# Patient Record
Sex: Male | Born: 1985 | Race: White | Hispanic: No | Marital: Single | Smoking: Never smoker
Health system: Southern US, Community
[De-identification: ages and names within clinical notes are randomized; demographics above are authoritative.]

---
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
## 2010-06-27 ENCOUNTER — Ambulatory Visit: Payer: Self-pay | Admitting: Family Medicine

## 2011-07-30 ENCOUNTER — Ambulatory Visit: Payer: Self-pay | Admitting: Family Medicine

## 2015-05-24 ENCOUNTER — Ambulatory Visit
Admission: RE | Admit: 2015-05-24 | Discharge: 2015-05-24 | Disposition: A | Discharge status: Home / Self Care | Payer: Self-pay | Source: Ambulatory Visit | Attending: General Practice | Admitting: General Practice

## 2015-05-24 ENCOUNTER — Other Ambulatory Visit: Payer: Self-pay | Admitting: *Deleted

## 2015-05-24 ENCOUNTER — Ambulatory Visit
Admission: RE | Admit: 2015-05-24 | Discharge: 2015-05-24 | Disposition: A | Discharge status: Home / Self Care | Payer: Managed Care, Other (non HMO) | Source: Ambulatory Visit | Attending: Diagnostic Radiology | Admitting: Diagnostic Radiology

## 2015-05-24 DIAGNOSIS — M542 Cervicalgia: Secondary | ICD-10-CM | POA: Insufficient documentation

## 2015-05-24 DIAGNOSIS — R200 Anesthesia of skin: Secondary | ICD-10-CM | POA: Insufficient documentation

## 2015-05-24 DIAGNOSIS — R51 Headache: Secondary | ICD-10-CM | POA: Diagnosis not present

## 2015-05-24 DIAGNOSIS — R208 Other disturbances of skin sensation: Secondary | ICD-10-CM | POA: Insufficient documentation

## 2015-11-09 IMAGING — CR DG CERVICAL SPINE COMPLETE 4+V
1 series · 6 of 6 positions shown · non-contrast
Comparison: None.

CLINICAL DATA: Posterior neck pain and headaches. Left arm
numbness.

EXAM:
CERVICAL SPINE  4+ VIEWS

[Series 1: ap · 0.17mm/px · 6 of 6 slices shown]
[im 1/6]
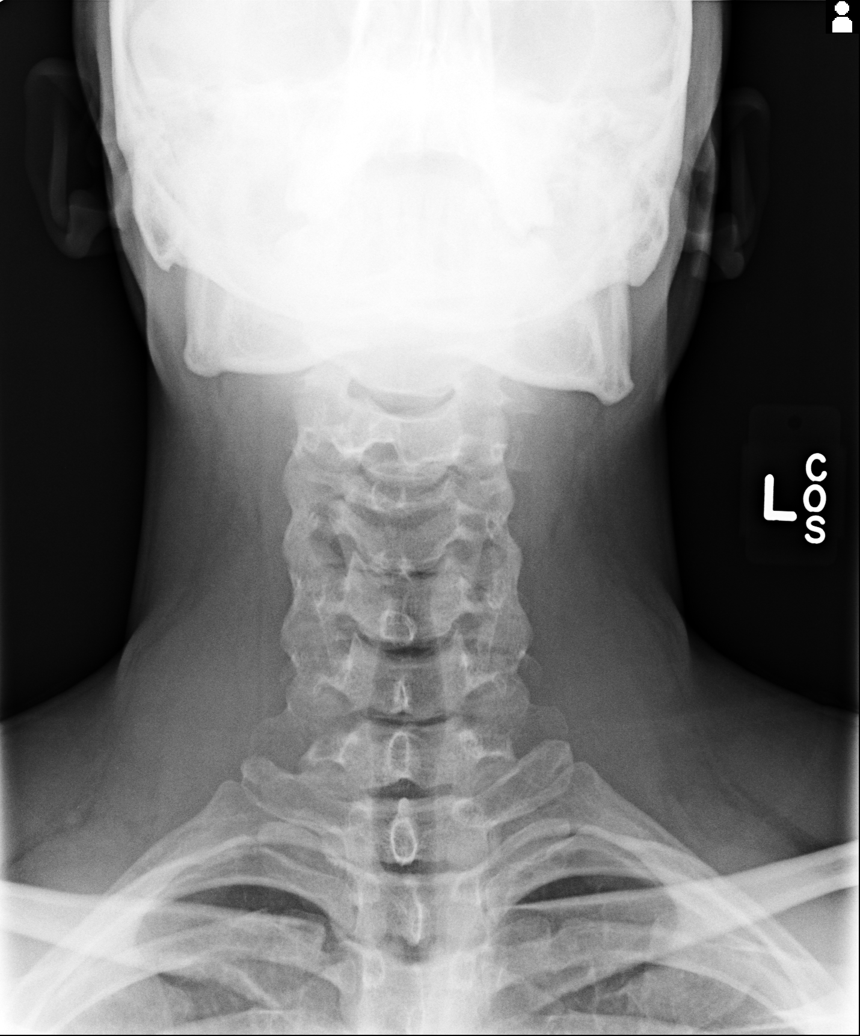
[im 2/6]
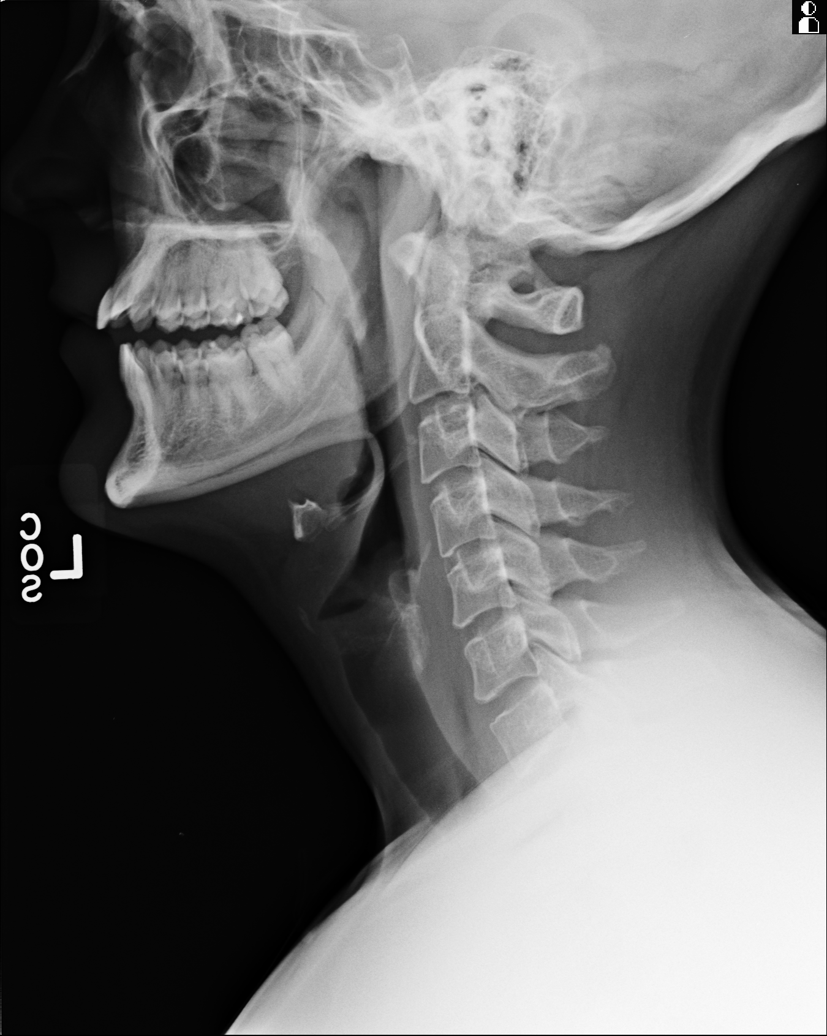
[im 3/6]
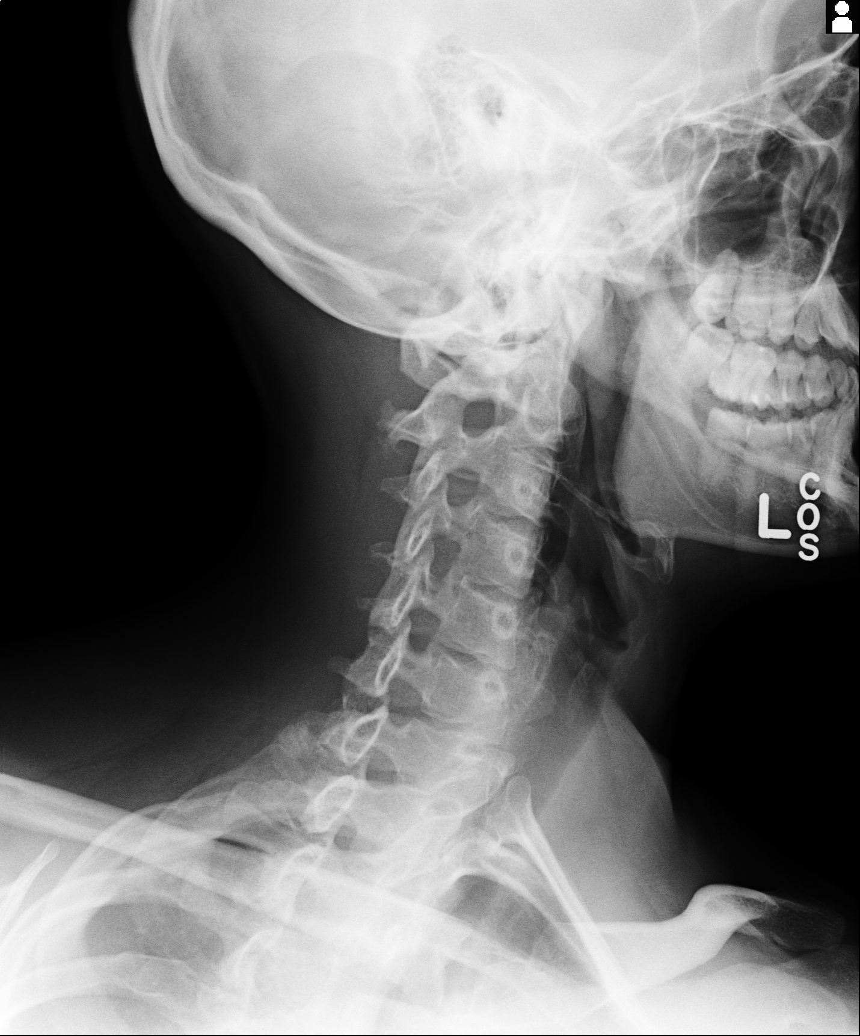
[im 4/6]
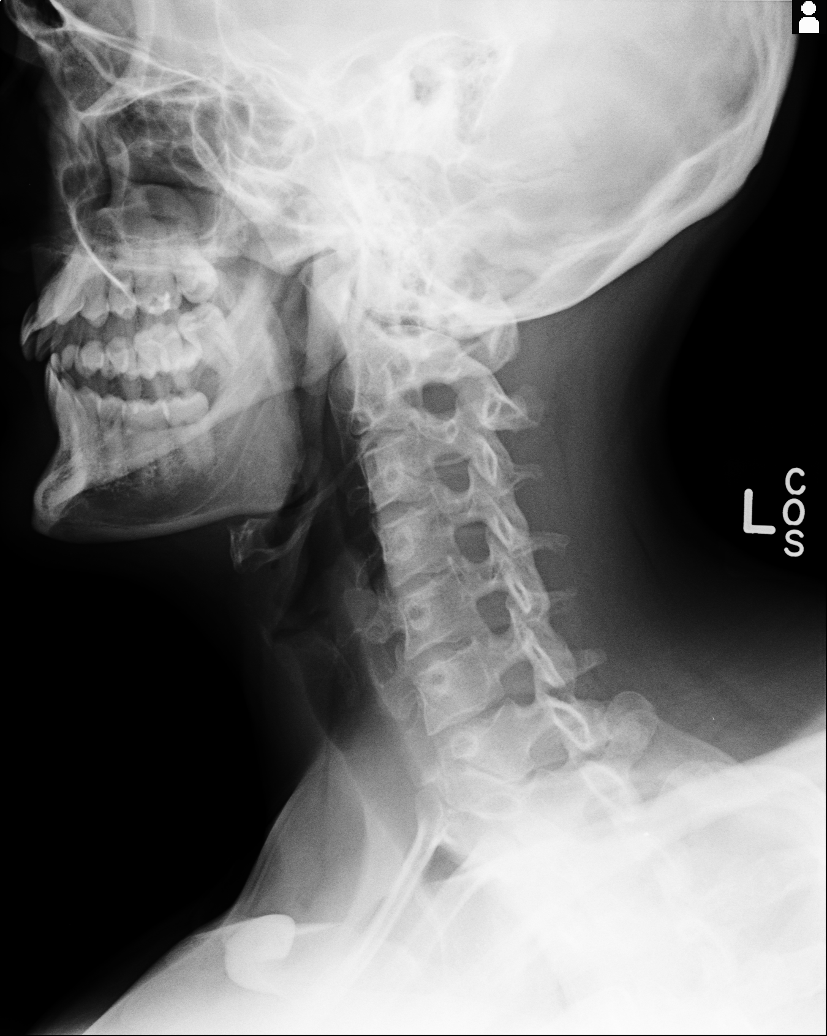
[im 5/6]
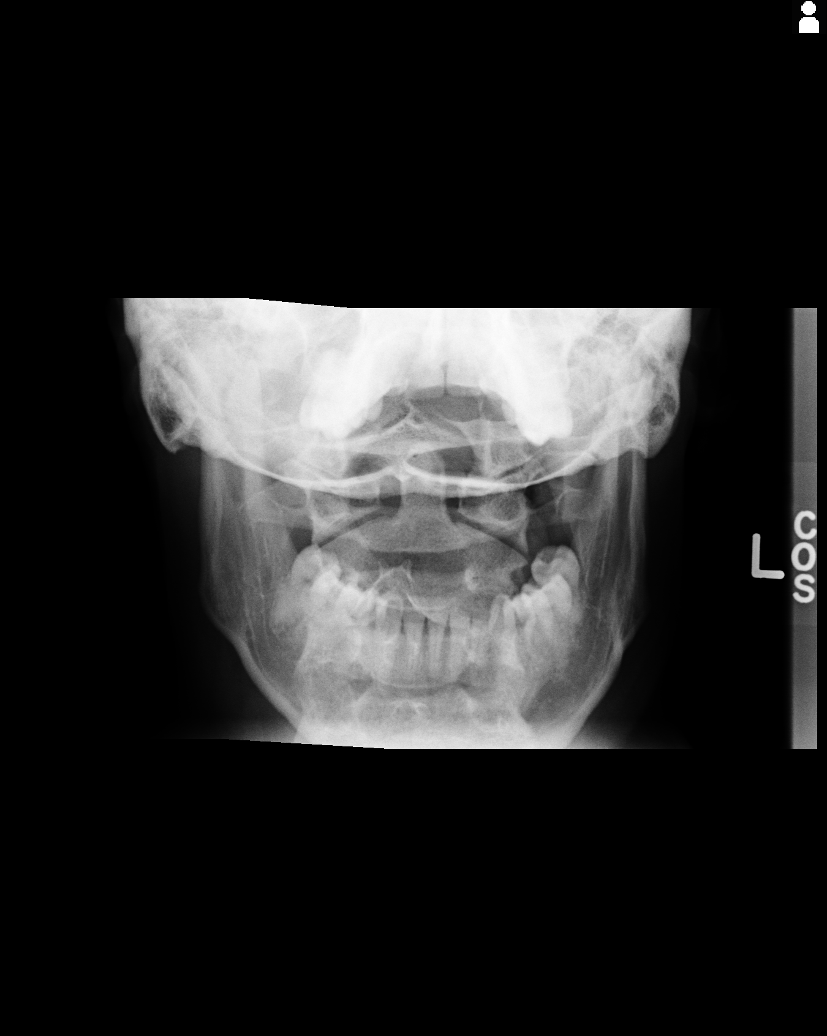
[im 6/6]
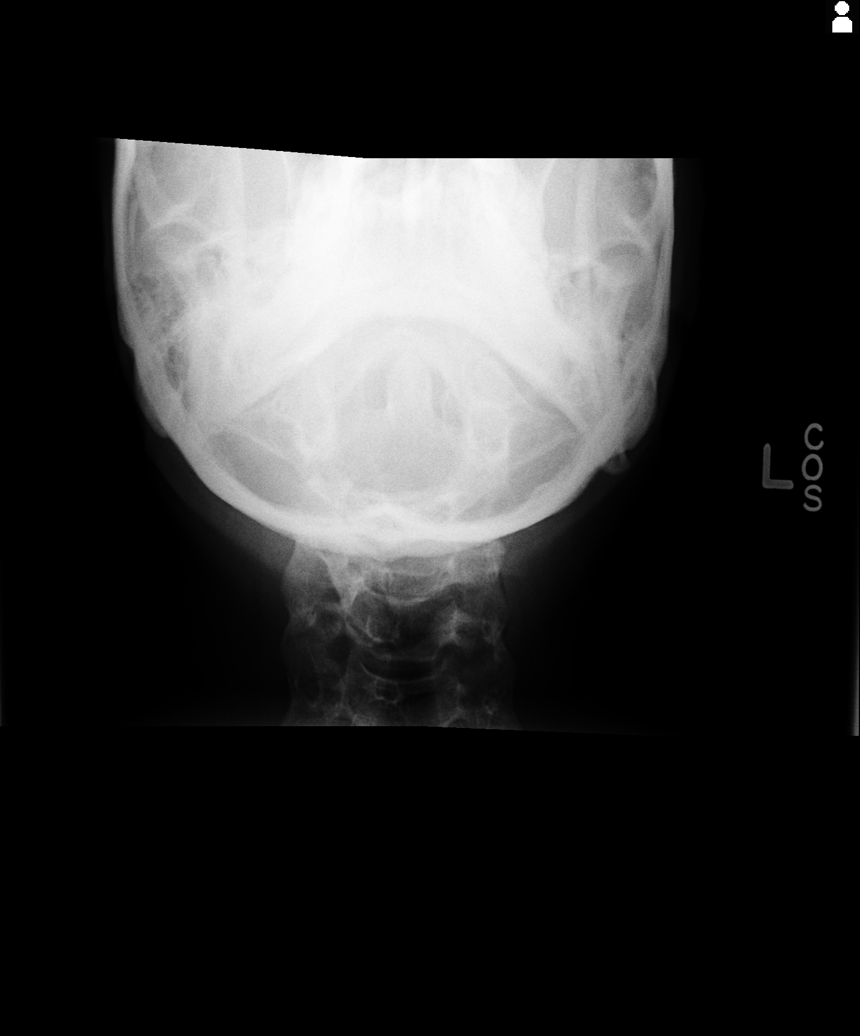

[6 of 6 positions shown; findings below may reference images not displayed]

FINDINGS: There is no evidence of cervical spine fracture or prevertebral soft
tissue swelling. Alignment is normal. No other significant bone
abnormalities are identified.
IMPRESSION: Negative cervical spine radiographs.

## 2023-02-19 ENCOUNTER — Encounter: Payer: Self-pay | Admitting: Emergency Medicine

## 2023-02-19 ENCOUNTER — Ambulatory Visit
Admission: EM | Admit: 2023-02-19 | Discharge: 2023-02-19 | Disposition: A | Discharge status: Home / Self Care | Payer: Commercial Managed Care - PPO | Attending: Emergency Medicine | Admitting: Emergency Medicine

## 2023-02-19 DIAGNOSIS — L03213 Periorbital cellulitis: Secondary | ICD-10-CM | POA: Diagnosis not present

## 2023-02-19 MED ORDER — AMOXICILLIN-POT CLAVULANATE 875-125 MG PO TABS: 1.0000 | ORAL_TABLET | Freq: Two times a day (BID) | ORAL | 0 refills | Status: DC

## 2023-02-19 MED ORDER — PREDNISONE 20 MG PO TABS: 40.0000 mg | ORAL_TABLET | Freq: Every day | ORAL | 0 refills | Status: DC

## 2023-02-19 MED ORDER — DEXAMETHASONE SODIUM PHOSPHATE 10 MG/ML IJ SOLN
10.0000 mg | Freq: Once | INTRAMUSCULAR | Status: AC
  Administered 2023-02-19: 10 mg via INTRAMUSCULAR

## 2023-02-19 NOTE — ED Provider Notes (Signed)
[facility]    CSN: [id number] Arrival date & time: 02/19/23  1551      History   Chief Complaint Chief Complaint  Patient presents with   Eye Problem    left    HPI Andrew Fitzgerald is a 37 y.o. male.    Patient presents for evaluation of erythema, swelling, pruritus and cracking present to the left upper eyelid beginning 4 days ago.  Drainage is present predominantly at nighttime.  Denies injury or trauma.  Denies fever, URI symptoms, light sensitivity or visual disturbance.  Has not attempted treatment.  History reviewed. No pertinent past medical history.  There are no problems to display for this patient.   History reviewed. No pertinent surgical history.     Home Medications    Prior to Admission medications   Medication Sig Start Date End Date Taking? Authorizing Provider  amoxicillin-clavulanate (AUGMENTIN) 875-125 MG tablet Take 1 tablet by mouth every 12 (twelve) hours. 02/19/23  Yes Tiernan Suto R, NP  predniSONE (DELTASONE) 20 MG tablet Take 2 tablets (40 mg total) by mouth daily. 02/19/23  Yes Hans Eden, NP    Family History History reviewed. No pertinent family history.  Social History Social History   Tobacco Use   Smoking status: Never   Smokeless tobacco: Never  Vaping Use   Vaping Use: Never used  Substance Use Topics   Alcohol use: Not Currently   Drug use: Never     Allergies   Patient has no known allergies.   Review of Systems Review of Systems  Constitutional: Negative.   HENT: Negative.    Eyes:  Positive for pain, discharge, redness and itching. Negative for photophobia and visual disturbance.  Respiratory: Negative.    Cardiovascular: Negative.      Physical Exam Triage Vital Signs ED Triage Vitals  Enc Vitals Group     BP 02/19/23 1718 (!) 151/83     Pulse Rate 02/19/23 1718 78     Resp 02/19/23 1718 15     Temp 02/19/23 1718 98.1 F (36.7 C)     Temp Source 02/19/23 1718 Oral     SpO2  02/19/23 1718 99 %     Weight 02/19/23 1717 180 lb (81.6 kg)     Height 02/19/23 1717 '5\' 9"'$  (1.753 m)     Head Circumference --      Peak Flow --      Pain Score 02/19/23 1717 7     Pain Loc --      Pain Edu? --      Excl. in [place]? --    No data found.  Updated Vital Signs BP (!) 151/83 (BP Location: Left Arm)   Pulse 78   Temp 98.1 F (36.7 C) (Oral)   Resp 15   Ht '5\' 9"'$  (1.753 m)   Wt 180 lb (81.6 kg)   SpO2 99%   BMI 26.58 kg/m   Visual Acuity Right Eye Distance: 20/20 uncorrected Left Eye Distance: 20/30 uncorrected Bilateral Distance: 20/15 uncorrected  Right Eye Near:   Left Eye Near:    Bilateral Near:     Physical Exam Constitutional:      Appearance: Normal appearance.  Eyes:     Comments: Moderate swelling of the left upper eyelid with erythema and drainage present along the lash line, vision is grossly intact, extraocular movements intact, no erythema present to sclera conjunctiva  Pulmonary:     Effort: Pulmonary effort is normal.  Neurological:  Mental Status: He is alert and oriented to person, place, and time. Mental status is at baseline.      UC Treatments / Results  Labs (all labs ordered are listed, but only abnormal results are displayed) Labs Reviewed - No data to display  EKG   Radiology No results found.  Procedures Procedures (including critical care time)  Medications Ordered in UC Medications  dexamethasone (DECADRON) injection 10 mg (has no administration in time range)    Initial Impression / Assessment and Plan / UC Course  I have reviewed the triage vital signs and the nursing notes.  Pertinent labs & imaging results that were available during my care of the patient were reviewed by me and considered in my medical decision making (see chart for details).  Periorbital cellulitis of left eye  Presentation is consistent with above diagnosis, discussed with patient, treating with oral antibiotics, Augmentin prescribed,  given strict precautions that if no improvement seen within 72 hours he is to return for reevaluation.,  Decadron injection given in office due to pain and significant swelling, prescribed prednisone course for home use additionally, may use Tylenol and or antihistamines for management of pain and pruritus, may follow-up with his urgent care as needed Final Clinical Impressions(s) / UC Diagnoses   Final diagnoses:  Periorbital cellulitis of left eye     Discharge Instructions      Today you are being treated for cellulitis of your eyelid which is a infection of the tissues of the skin  Begin Augmentin every morning and every evening for 10 days, if you have not begin to see any improvement within 72 hours please return for reevaluation  You have been given an injection of steroids here in the office to help reduce pain and swelling, daily you will start to see some improvement in about 30 minutes  Starting tomorrow take prednisone every morning with food for 5 days to continue the above process  You may take Tylenol 500 to 1000 mg every 6 hours as needed for additional comfort  Avoid eye touching or rubbing to prevent further contamination and irritation  You may help cool to warm compresses to the eye for comfort and to remove secretions  You may follow-up with his urgent care as needed for any concerns   ED Prescriptions     Medication Sig Dispense Auth. Provider   predniSONE (DELTASONE) 20 MG tablet Take 2 tablets (40 mg total) by mouth daily. 10 tablet Lowella Petties R, NP   amoxicillin-clavulanate (AUGMENTIN) 875-125 MG tablet Take 1 tablet by mouth every 12 (twelve) hours. 14 tablet Bedie Dominey, Leitha Schuller, NP      PDMP not reviewed this encounter.   Hans Eden, NP 02/19/23 [phone]

## 2023-02-19 NOTE — Discharge Instructions (Signed)
Today you are being treated for cellulitis of your eyelid which is a infection of the tissues of the skin  Begin Augmentin every morning and every evening for 10 days, if you have not begin to see any improvement within 72 hours please return for reevaluation  You have been given an injection of steroids here in the office to help reduce pain and swelling, daily you will start to see some improvement in about 30 minutes  Starting tomorrow take prednisone every morning with food for 5 days to continue the above process  You may take Tylenol 500 to 1000 mg every 6 hours as needed for additional comfort  Avoid eye touching or rubbing to prevent further contamination and irritation  You may help cool to warm compresses to the eye for comfort and to remove secretions  You may follow-up with his urgent care as needed for any concerns

## 2023-02-19 NOTE — ED Triage Notes (Signed)
Patient reports redness, swelling, itching and pain in his upper left eye that started on Monday.  Patient denies any cold symptoms.  Patient denies any injury to the eye.

## 2023-04-20 ENCOUNTER — Other Ambulatory Visit: Payer: Self-pay

## 2023-04-20 ENCOUNTER — Ambulatory Visit
Admission: EM | Admit: 2023-04-20 | Discharge: 2023-04-20 | Disposition: A | Discharge status: Home / Self Care | Payer: Commercial Managed Care - PPO | Attending: Nurse Practitioner | Admitting: Nurse Practitioner

## 2023-04-20 DIAGNOSIS — H00031 Abscess of right upper eyelid: Secondary | ICD-10-CM | POA: Diagnosis not present

## 2023-04-20 MED ORDER — LEVOFLOXACIN 500 MG PO TABS: 500.0000 mg | ORAL_TABLET | Freq: Every day | ORAL | 0 refills | Status: AC

## 2023-04-20 NOTE — ED Provider Notes (Signed)
[facility]    CSN: [id number] Arrival date & time: 04/20/23  0900      History   Chief Complaint Chief Complaint  Patient presents with   Eye Drainage   ithcy    HPI Andrew Fitzgerald is a 37 y.o. male resents for evaluation of an eye lid infection.  Patient states for the past couple days he has had increasing redness swelling and pain to his right upper eyelid.  Does endorse some drainage from the lash line as well.  Denies any fevers or chills, facial swelling.  He had similar symptoms before back on March 1 where he was treated for left periorbital cellulitis with Augmentin and prednisone.  He had no improvement in symptoms and was then switched to Keflex by his PCP.  He continued to have worsening symptoms and did go see an ophthalmologist that placed him on Levaquin which she states resolved his symptoms.  Reports his current symptoms are the exact same presentation as his previous cellulitis.  Denies fevers or chills.  No injury.  No vision changes.  He has been using an over-the-counter Vaseline to the area without improvement.  No other concerns at this time.  HPI  History reviewed. No pertinent past medical history.  There are no problems to display for this patient.   History reviewed. No pertinent surgical history.     Home Medications    Prior to Admission medications   Medication Sig Start Date End Date Taking? Authorizing Provider  levofloxacin (LEVAQUIN) 500 MG tablet Take 1 tablet (500 mg total) by mouth daily for 7 days. 04/20/23 04/27/23 Yes Radford Pax, NP    Family History History reviewed. No pertinent family history.  Social History Social History   Tobacco Use   Smoking status: Never   Smokeless tobacco: Never  Vaping Use   Vaping Use: Never used  Substance Use Topics   Alcohol use: Not Currently   Drug use: Never     Allergies   Patient has no known allergies.   Review of Systems Review of Systems  Eyes:        Right  upper eyelid swelling     Physical Exam Triage Vital Signs ED Triage Vitals  Enc Vitals Group     BP 04/20/23 1003 132/82     Pulse Rate 04/20/23 1003 77     Resp 04/20/23 1003 20     Temp 04/20/23 1003 98.8 F (37.1 C)     Temp src --      SpO2 04/20/23 1003 100 %     Weight --      Height --      Head Circumference --      Peak Flow --      Pain Score 04/20/23 1002 2     Pain Loc --      Pain Edu? --      Excl. in [place]? --    No data found.  Updated Vital Signs BP 132/82   Pulse 77   Temp 98.8 F (37.1 C)   Resp 20   SpO2 100%   Visual Acuity Right Eye Distance:   Left Eye Distance:   Bilateral Distance:    Right Eye Near:   Left Eye Near:    Bilateral Near:     Physical Exam Vitals and nursing note reviewed.  Constitutional:      General: He is not in acute distress.    Appearance: Normal appearance. He is not  ill-appearing.  HENT:     Head: Normocephalic and atraumatic.     Nose: Nose normal.  Eyes:     Pupils: Pupils are equal, round, and reactive to light.     Comments: Moderate swelling and erythema with warmth of the right upper eyelid.  Dried drainage noted along the lash line.  No obvious stye.  Cardiovascular:     Rate and Rhythm: Normal rate.  Pulmonary:     Effort: Pulmonary effort is normal.  Skin:    General: Skin is warm and dry.  Neurological:     General: No focal deficit present.     Mental Status: He is alert and oriented to person, place, and time.  Psychiatric:        Mood and Affect: Mood normal.        Behavior: Behavior normal.      UC Treatments / Results  Labs (all labs ordered are listed, but only abnormal results are displayed) Labs Reviewed - No data to display Comprehensive Metabolic Panel (CMP) Order: [id number] Component Ref Range & Units 1 mo ago  Glucose 70 - 110 mg/dL 409  Sodium 811 - 914 mmol/L 141  Potassium 3.6 - 5.1 mmol/L 4.8  Chloride 97 - 109 mmol/L 105  Carbon Dioxide (CO2) 22.0 - 32.0  mmol/L 29.2  Urea Nitrogen (BUN) 7 - 25 mg/dL 11  Creatinine 0.7 - 1.3 mg/dL 0.8  Glomerular Filtration Rate (eGFR) >60 mL/min/1.73sq m 119  Comment: CKD-EPI (2021) does not include patient's race in the calculation of eGFR.  Monitoring changes of plasma creatinine and eGFR over time is useful for monitoring kidney function.  Interpretive Ranges for eGFR (CKD-EPI 2021):  eGFR:       >60 mL/min/1.73 sq. m - Normal eGFR:       30-59 mL/min/1.73 sq. m - Moderately Decreased eGFR:       15-29 mL/min/1.73 sq. m  - Severely Decreased eGFR:       < 15 mL/min/1.73 sq. m  - Kidney Failure   Note: These eGFR calculations do not apply in acute situations when eGFR is changing rapidly or patients on dialysis.  Calcium 8.7 - 10.3 mg/dL 9.7  AST 8 - 39 U/L 32  ALT 6 - 57 U/L 61 High   Alk Phos (alkaline Phosphatase) 34 - 104 U/L 123 High   Albumin 3.5 - 4.8 g/dL 4.4  Bilirubin, Total 0.3 - 1.2 mg/dL 0.8  Protein, Total 6.1 - 7.9 g/dL 6.6  A/G Ratio 1.0 - 5.0 gm/dL 2.0  Resulting Agency [facility]   Specimen Collected: 03/02/23 09:32   Performed by: [facility] Last Resulted: 03/02/23 12:56    EKG   Radiology No results found.  Procedures Procedures (including critical care time)  Medications Ordered in UC Medications - No data to display  Initial Impression / Assessment and Plan / UC Course  I have reviewed the triage vital signs and the nursing notes.  Pertinent labs & imaging results that were available during my care of the patient were reviewed by me and considered in my medical decision making (see chart for details).     Reviewed recent urgent care notes as well as recent internal medicine notes from March as well as recent labs Patient reports symptoms presenting the exact same way as his previous cellulitis.  Reports having been on 2 different antibiotics prior to Levaquin which eventually did resolve his symptoms.  He is concerned  this will  progress as before.  I did discuss side effect profile in length with patient regarding Levaquin.  He verbalized understanding and would like to proceed.  Did advise he take a probiotic OTC He is to follow-up with his PCP in 2 days for recheck Strict ER precautions reviewed and patient verbalized understanding Final Clinical Impressions(s) / UC Diagnoses   Final diagnoses:  Cellulitis of right upper eyelid     Discharge Instructions      Levaquin once a day for 7 days.  Warm compresses to the eye as needed Please follow-up with your PCP in 2 days for recheck Please go to the ER ASAP for any worsening symptoms   ED Prescriptions     Medication Sig Dispense Auth. Provider   levofloxacin (LEVAQUIN) 500 MG tablet Take 1 tablet (500 mg total) by mouth daily for 7 days. 7 tablet Radford Pax, NP      PDMP not reviewed this encounter.   Radford Pax, NP 04/20/23 1048

## 2023-04-20 NOTE — Discharge Instructions (Addendum)
Levaquin once a day for 7 days.  Warm compresses to the eye as needed Please follow-up with your PCP in 2 days for recheck Please go to the ER ASAP for any worsening symptoms

## 2023-04-20 NOTE — ED Triage Notes (Addendum)
Right eye reddness, itchy, swelling. Denies pain or diff seeing. States eye feels "irritated" pt sates same thing happened to left eye and it took awhile for the MD to find what worked for improvement.

## 2023-08-10 ENCOUNTER — Other Ambulatory Visit: Payer: Self-pay | Admitting: Internal Medicine

## 2023-08-10 DIAGNOSIS — R1084 Generalized abdominal pain: Secondary | ICD-10-CM

## 2023-08-10 DIAGNOSIS — R590 Localized enlarged lymph nodes: Secondary | ICD-10-CM

## 2023-08-10 DIAGNOSIS — R634 Abnormal weight loss: Secondary | ICD-10-CM

## 2023-08-18 ENCOUNTER — Ambulatory Visit
Admission: RE | Admit: 2023-08-18 | Discharge: 2023-08-18 | Disposition: A | Discharge status: Home / Self Care | Payer: Commercial Managed Care - PPO | Source: Ambulatory Visit | Attending: Internal Medicine | Admitting: Internal Medicine

## 2023-08-18 DIAGNOSIS — R1084 Generalized abdominal pain: Secondary | ICD-10-CM | POA: Insufficient documentation

## 2023-08-18 DIAGNOSIS — R634 Abnormal weight loss: Secondary | ICD-10-CM | POA: Insufficient documentation

## 2023-08-18 DIAGNOSIS — R590 Localized enlarged lymph nodes: Secondary | ICD-10-CM | POA: Insufficient documentation

## 2023-08-18 MED ORDER — IOHEXOL 300 MG/ML  SOLN
100.0000 mL | Freq: Once | INTRAMUSCULAR | Status: AC | PRN
  Administered 2023-08-18: 100 mL via INTRAVENOUS
